# Patient Record
Sex: Female | Born: 2000 | Race: Black or African American | Hispanic: No | Marital: Single | Smoking: Never smoker
Health system: Southern US, Community
[De-identification: ages and names within clinical notes are randomized; demographics above are authoritative.]

## PROBLEM LIST (undated history)

## (undated) DIAGNOSIS — Z789 Other specified health status: Secondary | ICD-10-CM

## (undated) HISTORY — PX: NO PAST SURGERIES: SHX2092

## (undated) HISTORY — DX: Other specified health status: Z78.9

---

## 2019-01-28 ENCOUNTER — Emergency Department (HOSPITAL_COMMUNITY): Payer: BLUE CROSS/BLUE SHIELD

## 2019-01-28 ENCOUNTER — Emergency Department (HOSPITAL_COMMUNITY)
Admission: EM | Admit: 2019-01-28 | Discharge: 2019-01-28 | Disposition: A | Discharge status: Home / Self Care | Payer: BLUE CROSS/BLUE SHIELD | Attending: Emergency Medicine | Admitting: Emergency Medicine

## 2019-01-28 DIAGNOSIS — Y998 Other external cause status: Secondary | ICD-10-CM | POA: Diagnosis not present

## 2019-01-28 DIAGNOSIS — Y939 Activity, unspecified: Secondary | ICD-10-CM | POA: Insufficient documentation

## 2019-01-28 DIAGNOSIS — M25562 Pain in left knee: Secondary | ICD-10-CM | POA: Insufficient documentation

## 2019-01-28 DIAGNOSIS — Y92410 Unspecified street and highway as the place of occurrence of the external cause: Secondary | ICD-10-CM | POA: Diagnosis not present

## 2019-01-28 MED ORDER — ACETAMINOPHEN 325 MG PO TABS
650.0000 mg | ORAL_TABLET | Freq: Once | ORAL | Status: AC
  Administered 2019-01-28: 650 mg via ORAL
  Filled 2019-01-28: qty 2

## 2019-01-28 NOTE — Discharge Instructions (Addendum)
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
## 2020-07-22 ENCOUNTER — Ambulatory Visit: Payer: No Typology Code available for payment source | Attending: Internal Medicine

## 2020-07-22 DIAGNOSIS — Z23 Encounter for immunization: Secondary | ICD-10-CM

## 2020-07-22 NOTE — Progress Notes (Signed)
   Covid-19 Vaccination Clinic  Name:  Allison Salas    MRN: [id number] DOB: Dec 24, 2000  07/22/2020  [name] was observed post Covid-19 immunization for 15 minutes without incident. She was provided with Vaccine Information Sheet and instruction to access the V-Safe system.   [name] was instructed to call 911 with any severe reactions post vaccine: Marland Kitchen Difficulty breathing  . Swelling of face and throat  . A fast heartbeat  . A bad rash all over body  . Dizziness and weakness   Immunizations Administered    Name Date Dose VIS Date Route   Moderna COVID-19 Vaccine 07/22/2020 12:48 PM 0.5 mL 11/2019 Intramuscular   Manufacturer: Moderna   Lot: [id number]   NDC: 01749-449-67

## 2020-08-19 ENCOUNTER — Ambulatory Visit: Payer: No Typology Code available for payment source | Attending: Internal Medicine

## 2020-08-19 DIAGNOSIS — Z23 Encounter for immunization: Secondary | ICD-10-CM

## 2020-08-19 NOTE — Progress Notes (Signed)
   Covid-19 Vaccination Clinic  Name:  Allison Salas    MRN: [id number] DOB: [birth year]  08/19/2020  Ms. Wertheim was observed post Covid-19 immunization for 15 minutes without incident. She was provided with Vaccine Information Sheet and instruction to access the V-Safe system.   Ms. Harl was instructed to call 911 with any severe reactions post vaccine: Marland Kitchen Difficulty breathing  . Swelling of face and throat  . A fast heartbeat  . A bad rash all over body  . Dizziness and weakness   Immunizations Administered    Name Date Dose VIS Date Route   Moderna COVID-19 Vaccine 08/19/2020  9:30 AM 0.5 mL 11/2019 Intramuscular   Manufacturer: Moderna   Lot: [id number]   NDC: 15400-867-61

## 2020-11-24 ENCOUNTER — Emergency Department (HOSPITAL_COMMUNITY)
Admission: EM | Admit: 2020-11-24 | Discharge: 2020-11-25 | Disposition: A | Discharge status: Home / Self Care | Payer: BLUE CROSS/BLUE SHIELD | Attending: Emergency Medicine | Admitting: Emergency Medicine

## 2020-11-24 ENCOUNTER — Encounter (HOSPITAL_COMMUNITY): Payer: Self-pay | Admitting: Emergency Medicine

## 2020-11-24 ENCOUNTER — Other Ambulatory Visit: Payer: Self-pay

## 2020-11-24 DIAGNOSIS — R197 Diarrhea, unspecified: Secondary | ICD-10-CM | POA: Insufficient documentation

## 2020-11-24 DIAGNOSIS — R251 Tremor, unspecified: Secondary | ICD-10-CM | POA: Diagnosis not present

## 2020-11-24 DIAGNOSIS — F1590 Other stimulant use, unspecified, uncomplicated: Secondary | ICD-10-CM | POA: Diagnosis not present

## 2020-11-24 DIAGNOSIS — Z5321 Procedure and treatment not carried out due to patient leaving prior to being seen by health care provider: Secondary | ICD-10-CM | POA: Insufficient documentation

## 2020-11-24 DIAGNOSIS — R110 Nausea: Secondary | ICD-10-CM | POA: Diagnosis not present

## 2020-11-24 DIAGNOSIS — R42 Dizziness and giddiness: Secondary | ICD-10-CM | POA: Insufficient documentation

## 2020-11-24 NOTE — ED Triage Notes (Signed)
Patient smoked marijuana this evening then  felt dizzy ,shaking with nausea and diarrhea x1 . Respirations unlabored.

## 2020-11-25 LAB — BASIC METABOLIC PANEL
Anion gap: 9 (ref 5–15)
BUN: 9 mg/dL (ref 6–20)
CO2: 20 mmol/L — ABNORMAL LOW (ref 22–32)
Calcium: 9 mg/dL (ref 8.9–10.3)
Chloride: 105 mmol/L (ref 98–111)
Creatinine, Ser: 0.75 mg/dL (ref 0.44–1.00)
GFR, Estimated: >60 mL/min (ref >60)
Glucose, Bld: 116 mg/dL — ABNORMAL HIGH (ref 70–99)
Potassium: 3.1 mmol/L — ABNORMAL LOW (ref 3.5–5.1)
Sodium: 134 mmol/L — ABNORMAL LOW (ref 135–145)

## 2020-11-25 LAB — CBC WITH DIFFERENTIAL/PLATELET
Abs Immature Granulocytes: 0.03 10*3/uL (ref 0.00–0.07)
Basophils Absolute: 0 10*3/uL (ref 0.0–0.1)
Basophils Relative: 1 %
Eosinophils Absolute: 0.2 10*3/uL (ref 0.0–0.5)
Eosinophils Relative: 2 %
HCT: 37.8 % (ref 36.0–46.0)
Hemoglobin: 12.9 g/dL (ref 12.0–15.0)
Immature Granulocytes: 0 %
Lymphocytes Relative: 36 %
Lymphs Abs: 2.6 10*3/uL (ref 0.7–4.0)
MCH: 32.5 pg (ref 26.0–34.0)
MCHC: 34.1 g/dL (ref 30.0–36.0)
MCV: 95.2 fL (ref 80.0–100.0)
Monocytes Absolute: 1 10*3/uL (ref 0.1–1.0)
Monocytes Relative: 14 %
Neutro Abs: 3.4 10*3/uL (ref 1.7–7.7)
Neutrophils Relative %: 47 %
Platelets: 273 10*3/uL (ref 150–400)
RBC: 3.97 MIL/uL (ref 3.87–5.11)
RDW: 13.3 % (ref 11.5–15.5)
WBC: 7.2 10*3/uL (ref 4.0–10.5)
nRBC: 0 % (ref 0.0–0.2)

## 2020-11-25 LAB — URINALYSIS, ROUTINE W REFLEX MICROSCOPIC
Bilirubin Urine: NEGATIVE
Glucose, UA: NEGATIVE mg/dL
Hgb urine dipstick: NEGATIVE
Ketones, ur: NEGATIVE mg/dL
Leukocytes,Ua: NEGATIVE
Nitrite: NEGATIVE
Protein, ur: NEGATIVE mg/dL
Specific Gravity, Urine: 1.024 (ref 1.005–1.030)
pH: 5 (ref 5.0–8.0)

## 2020-11-25 LAB — I-STAT BETA HCG BLOOD, ED (MC, WL, AP ONLY): I-stat hCG, quantitative: <5 m[IU]/mL (ref <5)

## 2020-11-25 NOTE — ED Notes (Signed)
Patient states she knows she isnt supposed to leave but she needs to lay down. LWBS

## 2021-04-30 LAB — LAB REPORT - SCANNED: Preg, Serum: POSITIVE

## 2021-05-01 LAB — OB RESULTS CONSOLE ANTIBODY SCREEN: Antibody Screen: NEGATIVE

## 2021-05-01 LAB — OB RESULTS CONSOLE ABO/RH: RH Type: POSITIVE

## 2021-07-16 ENCOUNTER — Encounter: Payer: Self-pay | Admitting: *Deleted

## 2021-07-16 DIAGNOSIS — Z3400 Encounter for supervision of normal first pregnancy, unspecified trimester: Secondary | ICD-10-CM | POA: Insufficient documentation

## 2021-07-23 ENCOUNTER — Other Ambulatory Visit (HOSPITAL_COMMUNITY)
Admission: RE | Admit: 2021-07-23 | Discharge: 2021-07-23 | Disposition: A | Discharge status: Home / Self Care | Payer: BLUE CROSS/BLUE SHIELD | Source: Ambulatory Visit | Attending: Obstetrics and Gynecology | Admitting: Obstetrics and Gynecology

## 2021-07-23 ENCOUNTER — Other Ambulatory Visit: Payer: Self-pay

## 2021-07-23 ENCOUNTER — Encounter: Payer: Self-pay | Admitting: Obstetrics and Gynecology

## 2021-07-23 ENCOUNTER — Ambulatory Visit (INDEPENDENT_AMBULATORY_CARE_PROVIDER_SITE_OTHER): Payer: BLUE CROSS/BLUE SHIELD | Admitting: Obstetrics and Gynecology

## 2021-07-23 VITALS — BP 117/73 | HR 91 | Temp 98.3°F | Wt 146.0 lb

## 2021-07-23 DIAGNOSIS — Z3A16 16 weeks gestation of pregnancy: Secondary | ICD-10-CM | POA: Diagnosis not present

## 2021-07-23 DIAGNOSIS — Z3A28 28 weeks gestation of pregnancy: Secondary | ICD-10-CM | POA: Diagnosis not present

## 2021-07-23 DIAGNOSIS — Z9229 Personal history of other drug therapy: Secondary | ICD-10-CM | POA: Diagnosis not present

## 2021-07-23 DIAGNOSIS — Z3400 Encounter for supervision of normal first pregnancy, unspecified trimester: Secondary | ICD-10-CM | POA: Diagnosis present

## 2021-07-23 DIAGNOSIS — Z23 Encounter for immunization: Secondary | ICD-10-CM | POA: Diagnosis not present

## 2021-07-23 DIAGNOSIS — R12 Heartburn: Secondary | ICD-10-CM

## 2021-07-23 MED ORDER — FAMOTIDINE 20 MG PO TABS: 20.0000 mg | ORAL_TABLET | Freq: Two times a day (BID) | ORAL | 2 refills | Status: AC

## 2021-07-23 MED ORDER — BLOOD PRESSURE MONITOR AUTOMAT DEVI: 1.0000 | Freq: Every day | 0 refills | Status: AC

## 2021-07-23 MED ORDER — GOJJI WEIGHT SCALE MISC: 1.0000 | Freq: Every day | 0 refills | Status: AC | PRN

## 2021-07-23 NOTE — Progress Notes (Signed)
INITIAL OBSTETRICAL VISIT Patient name: Allison Salas MRN [id number]  Date of birth: August 28, 2001 Chief Complaint:   Initial Prenatal Visit (NOB Transfer)  History of Present Illness:   Allison Salas is a 20 y.o. G1P0 African American female at [redacted]w[redacted]d by LMP with an Estimated Date of Delivery: 01/02/22 being seen today for her initial obstetrical visit.  Her obstetrical history is significant for  none . This is an unplanned pregnancy. She is transferring her [facility] from [facility]. She is a Consulting civil engineer at [employer]. She and the father of the baby (FOB) "Detrich" do not live together. She has a support system that consists of FOB/family/friends. She plans to deliver in her home town in [place], because she will be on [place] break during her due date. Today she reports heartburn.   Patient's last menstrual period was 03/28/2021. Last pap n/a. Results were:  n/a due to age Review of Systems:   Pertinent items are noted in HPI Denies cramping/contractions, leakage of fluid, vaginal bleeding, abnormal vaginal discharge w/ itching/odor/irritation, headaches, visual changes, shortness of breath, chest pain, abdominal pain, severe nausea/vomiting, or problems with urination or bowel movements unless otherwise stated above.  Pertinent History Reviewed:  Reviewed past medical,surgical, social, obstetrical and family history.  Reviewed problem list, medications and allergies. OB History  Gravida Para Term Preterm AB Living  1            SAB IAB Ectopic Multiple Live Births               # Outcome Date GA Lbr Len/2nd Weight Sex Delivery Anes PTL Lv  1 Current            Physical Assessment:   Vitals:   07/23/21 1405  BP: 117/73  Pulse: 91  Temp: 98.3 F (36.8 C)  Weight: 146 lb (66.2 kg)  Body mass index is 25.86 kg/m.       Physical Examination:  General appearance - well appearing, and in no distress  Mental status - alert, oriented to person, place, and  time  Psych:  She has a normal mood and affect  Skin - warm and dry, normal color, no suspicious lesions noted  Chest - effort normal, all lung fields clear to auscultation bilaterally  Heart - normal rate and regular rhythm  Abdomen - soft, nontender  Extremities:  No swelling or varicosities noted  Pelvic - VULVA: normal appearing vulva with no masses, tenderness or lesions  VAGINA: normal appearing vagina with normal color and discharge, no lesions.   CERVIX: normal appearing cervix without discharge or lesions, no CMT  Thin prep pap is done with reflex HR HPV cotesting   FHTs by doppler: 147 bpm  Assessment & Plan:  1) Low-Risk Pregnancy G1P0 at [redacted]w[redacted]d with an Estimated Date of Delivery: 01/02/22   2) Initial OB visit - Welcomed to practice and introduced self to patient in addition to discussing other advanced practice providers that she may be seeing at this practice - Congratulated patient - Anticipatory guidance on upcoming appointments - Educated on COVID19 and pregnancy and the integration of virtual appointments  - Educated on babyscripts app- patient reports she has not received email, encouraged to look in spam folder and to call office if she still has not received email - patient verbalizes understanding    3) Supervision of normal first pregnancy, antepartum - Cytology - PAP( [place]) - Cervicovaginal ancillary only( [place]) - Obstetric Panel, Including HIV - Hepatitis C  antibody - Genetic Screening - Hemoglobin A1c - Culture, OB Urine - Blood Pressure Monitoring (BLOOD PRESSURE MONITOR AUTOMAT) DEVI; 1 Device by Does not apply route daily. Automatic blood pressure cuff regular size. To monitor blood pressure regularly at home. ICD-10 code:Z34.90  Dispense: 1 each; Refill: 0 - Misc. Devices (GOJJI WEIGHT SCALE) MISC; 1 Device by Does not apply route daily as needed. To weight self daily as needed at home. ICD-10 code: Z34.90  Dispense: 1 each; Refill: 0 - Enroll  Patient in PreNatal Babyscripts - AFP, Serum, Open Spina Bifida - famotidine (PEPCID) 20 MG tablet; Take 1 tablet (20 mg total) by mouth 2 (two) times daily.  Dispense: 30 tablet; Refill: 2 - [place] MFM OB COMP + 14 WK; Future  2. Heart burn - famotidine (PEPCID) 20 MG tablet; Take 1 tablet (20 mg total) by mouth 2 (two) times daily.  Dispense: 30 tablet; Refill: 2  3. [redacted] weeks gestation of pregnancy    Meds:  Meds ordered this encounter  Medications   Blood Pressure Monitoring (BLOOD PRESSURE MONITOR AUTOMAT) DEVI    Sig: 1 Device by Does not apply route daily. Automatic blood pressure cuff regular size. To monitor blood pressure regularly at home. ICD-10 code:Z34.90    Dispense:  1 each    Refill:  0   Misc. Devices (GOJJI WEIGHT SCALE) MISC    Sig: 1 Device by Does not apply route daily as needed. To weight self daily as needed at home. ICD-10 code: Z34.90    Dispense:  1 each    Refill:  0   famotidine (PEPCID) 20 MG tablet    Sig: Take 1 tablet (20 mg total) by mouth 2 (two) times daily.    Dispense:  30 tablet    Refill:  2    Initial labs obtained Continue prenatal vitamins Reviewed n/v relief measures and warning s/s to report Reviewed recommended weight gain based on pre-gravid BMI Encouraged well-balanced diet Genetic Screening discussed: ordered Cystic fibrosis, SMA, Fragile X screening discussed ordered The nature of [facility] with multiple MDs and other Advanced Practice Providers was explained to patient; also emphasized that residents, students are part of our team.  Discussed optimized OB schedule and video visits. Advised can have an in-office visit whenever she feels she needs to be seen.  Does not have own BP cuff. BP cuff Rx faxed today. Explained to patient that BP will be mailed to her house. Check BP weekly, let us know if >140/90. Advised to call during normal business hours and there is an after-hours nurse line  available.    Follow-up: Return in about 4 weeks (around 08/20/2021) for Return OB - My Chart video.   Orders Placed This Encounter  Procedures   Culture, OB Urine   [place] MFM OB COMP + 14 WK   Obstetric Panel, Including HIV   Hepatitis C antibody   Genetic Screening   Hemoglobin A1c   AFP, Serum, Open Spina Bifida   OB RESULTS CONSOLE ABO/Rh   OB RESULTS CONSOLE Antibody Screen    Raelyn Mora MSN, CNM 07/23/2021

## 2021-07-24 ENCOUNTER — Encounter: Payer: Self-pay | Admitting: Obstetrics and Gynecology

## 2021-07-24 LAB — CERVICOVAGINAL ANCILLARY ONLY
Bacterial Vaginitis (gardnerella): POSITIVE — AB
Candida Glabrata: NEGATIVE
Candida Vaginitis: NEGATIVE
Chlamydia: NEGATIVE
Comment: NEGATIVE
Comment: NEGATIVE
Comment: NEGATIVE
Comment: NEGATIVE
Comment: NEGATIVE
Comment: NORMAL
Neisseria Gonorrhea: NEGATIVE
Trichomonas: NEGATIVE

## 2021-07-24 LAB — CYTOLOGY - PAP: Diagnosis: NEGATIVE

## 2021-07-25 LAB — AFP, SERUM, OPEN SPINA BIFIDA
AFP MoM: 1.49
AFP Value: 58.3 ng/mL
Gest. Age on Collection Date: 16 weeks
Maternal Age At EDD: 22.8 yr
OSBR Risk 1 IN: 5617
Test Results:: NEGATIVE
Weight: 146 [lb_av]

## 2021-07-25 LAB — OBSTETRIC PANEL, INCLUDING HIV
Antibody Screen: NEGATIVE
Basophils Absolute: 0.1 10*3/uL (ref 0.0–0.2)
Basos: 1 %
EOS (ABSOLUTE): 0.1 10*3/uL (ref 0.0–0.4)
Eos: 1 %
HIV Screen 4th Generation wRfx: NONREACTIVE
Hematocrit: 42.5 % (ref 34.0–46.6)
Hemoglobin: 13.9 g/dL (ref 11.1–15.9)
Hepatitis B Surface Ag: NEGATIVE
Immature Grans (Abs): 0.4 10*3/uL — ABNORMAL HIGH (ref 0.0–0.1)
Immature Granulocytes: 3 %
Lymphocytes Absolute: 1.7 10*3/uL (ref 0.7–3.1)
Lymphs: 12 %
MCH: 31.7 pg (ref 26.6–33.0)
MCHC: 32.7 g/dL (ref 31.5–35.7)
MCV: 97 fL (ref 79–97)
Monocytes Absolute: 1.5 10*3/uL — ABNORMAL HIGH (ref 0.1–0.9)
Monocytes: 10 %
Neutrophils Absolute: 10.6 10*3/uL — ABNORMAL HIGH (ref 1.4–7.0)
Neutrophils: 73 %
Platelets: 287 10*3/uL (ref 150–450)
RBC: 4.38 x10E6/uL (ref 3.77–5.28)
RDW: 12.9 % (ref 11.7–15.4)
RPR Ser Ql: NONREACTIVE
Rh Factor: POSITIVE
Rubella Antibodies, IGG: 6.27 index (ref >0.99)
WBC: 14.4 10*3/uL — ABNORMAL HIGH (ref 3.4–10.8)

## 2021-07-25 LAB — HEMOGLOBIN A1C
Est. average glucose Bld gHb Est-mCnc: 105 mg/dL
Hgb A1c MFr Bld: 5.3 % (ref 4.8–5.6)

## 2021-07-25 LAB — CULTURE, OB URINE

## 2021-07-25 LAB — URINE CULTURE, OB REFLEX: Organism ID, Bacteria: NO GROWTH

## 2021-07-25 LAB — HEPATITIS C ANTIBODY: Hep C Virus Ab: <0.1 s/co ratio (ref 0.0–0.9)

## 2021-07-27 ENCOUNTER — Other Ambulatory Visit: Payer: Self-pay | Admitting: *Deleted

## 2021-07-27 DIAGNOSIS — B9689 Other specified bacterial agents as the cause of diseases classified elsewhere: Secondary | ICD-10-CM

## 2021-07-27 DIAGNOSIS — O23599 Infection of other part of genital tract in pregnancy, unspecified trimester: Secondary | ICD-10-CM

## 2021-07-27 DIAGNOSIS — Z3400 Encounter for supervision of normal first pregnancy, unspecified trimester: Secondary | ICD-10-CM

## 2021-07-27 MED ORDER — METRONIDAZOLE 500 MG PO TABS: 500.0000 mg | ORAL_TABLET | Freq: Two times a day (BID) | ORAL | 0 refills | Status: DC

## 2021-08-10 ENCOUNTER — Other Ambulatory Visit: Payer: Self-pay

## 2021-08-10 ENCOUNTER — Ambulatory Visit: Payer: BLUE CROSS/BLUE SHIELD | Attending: Obstetrics and Gynecology

## 2021-08-10 DIAGNOSIS — Z3400 Encounter for supervision of normal first pregnancy, unspecified trimester: Secondary | ICD-10-CM

## 2021-08-20 ENCOUNTER — Encounter: Payer: Self-pay | Admitting: Obstetrics and Gynecology

## 2021-08-20 ENCOUNTER — Other Ambulatory Visit: Payer: Self-pay

## 2021-08-20 ENCOUNTER — Telehealth (INDEPENDENT_AMBULATORY_CARE_PROVIDER_SITE_OTHER): Payer: BLUE CROSS/BLUE SHIELD | Admitting: Obstetrics and Gynecology

## 2021-08-20 VITALS — BP 121/71 | HR 96 | Wt 155.2 lb

## 2021-08-20 DIAGNOSIS — Z3A20 20 weeks gestation of pregnancy: Secondary | ICD-10-CM

## 2021-08-20 DIAGNOSIS — Z3402 Encounter for supervision of normal first pregnancy, second trimester: Secondary | ICD-10-CM

## 2021-08-20 DIAGNOSIS — Z3400 Encounter for supervision of normal first pregnancy, unspecified trimester: Secondary | ICD-10-CM

## 2021-08-20 NOTE — Progress Notes (Signed)
   MY CHART VIDEO VIRTUAL OBSTETRICS VISIT ENCOUNTER NOTE  I connected with Allison Salas on 08/20/21 at  2:20 PM EDT by My Chart video at home and verified that I am speaking with the correct person using two identifiers. Provider located at [facility] for [employer] at [place].   I discussed the limitations, risks, security and privacy concerns of performing an evaluation and management service by My Chart video and the availability of in person appointments. I also discussed with the patient that there may be a patient responsible charge related to this service. The patient expressed understanding and agreed to proceed.  I discussed the limitations of telemedicine and the availability of in person appointments.  Discussed there is a possibility of technology failure and discussed alternative modes of communication if that failure occurs.  Subjective:  Allison Salas is a 20 y.o. G1P0 at [redacted]w[redacted]d being followed for ongoing prenatal care.  She is currently monitored for the following issues for this low-risk pregnancy and has Supervision of normal first pregnancy, antepartum on their problem list.  Patient reports no complaints. Reports fetal movement. Denies any contractions, bleeding or leaking of fluid.   The following portions of the patient's history were reviewed and updated as appropriate: allergies, current medications, past family history, past medical history, past social history, past surgical history and problem list.   Objective:   General:  Alert, oriented and cooperative.   Mental Status: Normal mood and affect perceived. Normal judgment and thought content.  Rest of physical exam deferred due to type of encounter  BP 121/71   Pulse 96   Wt 155 lb 3.2 oz (70.4 kg)   LMP 03/28/2021   BMI 27.49 kg/m  **Done by patient's own at home BP cuff and scale  Assessment and Plan:  Pregnancy: G1P0 at [redacted]w[redacted]d  1. Supervision of normal first pregnancy, antepartum -  Reviewed normal pap - Took all BV meds and no complaints  2. [redacted] weeks gestation of pregnancy   Term labor symptoms and general obstetric precautions including but not limited to vaginal bleeding, contractions, leaking of fluid and fetal movement were reviewed in detail with the patient.  I discussed the assessment and treatment plan with the patient. The patient was provided an opportunity to ask questions and all were answered. The patient agreed with the plan and demonstrated an understanding of the instructions. The patient was advised to call back or seek an in-person office evaluation/go to MAU at [facility] for any urgent or concerning symptoms. Please refer to After Visit Summary for other counseling recommendations.   I provided 5 minutes of non-face-to-face time during this encounter. There was 5 minutes of chart review time spent prior to this encounter. Total time spent = 10 minutes.  Return in about 4 weeks (around 09/17/2021) for Return OB - My Chart video.  Future Appointments  Date Time Provider Department Center  09/17/2021  1:15 PM Raelyn Mora, CNM [facility] None  10/15/2021  8:35 AM Raelyn Mora, CNM [facility] None    Raelyn Mora, CNM Center for [employer], [facility]

## 2021-09-17 ENCOUNTER — Other Ambulatory Visit: Payer: Self-pay

## 2021-09-17 ENCOUNTER — Telehealth (INDEPENDENT_AMBULATORY_CARE_PROVIDER_SITE_OTHER): Payer: BLUE CROSS/BLUE SHIELD | Admitting: Obstetrics and Gynecology

## 2021-09-17 ENCOUNTER — Encounter: Payer: Self-pay | Admitting: Obstetrics and Gynecology

## 2021-09-17 VITALS — BP 121/75 | HR 81 | Wt 165.4 lb

## 2021-09-17 DIAGNOSIS — Z3403 Encounter for supervision of normal first pregnancy, third trimester: Secondary | ICD-10-CM

## 2021-09-17 DIAGNOSIS — Z3400 Encounter for supervision of normal first pregnancy, unspecified trimester: Secondary | ICD-10-CM

## 2021-09-17 DIAGNOSIS — Z3A34 34 weeks gestation of pregnancy: Secondary | ICD-10-CM

## 2021-09-17 DIAGNOSIS — Z0130 Encounter for examination of blood pressure without abnormal findings: Secondary | ICD-10-CM

## 2021-09-17 NOTE — Progress Notes (Signed)
MY CHART VIDEO VIRTUAL OBSTETRICS VISIT ENCOUNTER NOTE  I connected with Allison Salas on 09/17/21 at  1:15 PM EDT by My Chart video at home and verified that I am speaking with the correct person using two identifiers. Provider located at [employer] for [employer] at [place].   I discussed the limitations, risks, security and privacy concerns of performing an evaluation and management service by My Chart video and the availability of in person appointments. I also discussed with the patient that there may be a patient responsible charge related to this service. The patient expressed understanding and agreed to proceed.  I discussed the limitations of telemedicine and the availability of in person appointments.  Discussed there is a possibility of technology failure and discussed alternative modes of communication if that failure occurs.  Subjective:  Allison Salas is a 20 y.o. G1P0 at [redacted]w[redacted]d being followed for ongoing prenatal care.  She is currently monitored for the following issues for this low-risk pregnancy and has Supervision of normal first pregnancy, antepartum on their problem list.  Patient reports no complaints. Reports fetal movement. Denies any contractions, bleeding or leaking of fluid.   The following portions of the patient's history were reviewed and updated as appropriate: allergies, current medications, past family history, past medical history, past social history, past surgical history and problem list.   Objective:   General:  Alert, oriented and cooperative.   Mental Status: Normal mood and affect perceived. Normal judgment and thought content.  Rest of physical exam deferred due to type of encounter  BP 121/75   Pulse 81   Wt 165 lb 6.4 oz (75 kg)   LMP 03/28/2021   BMI 29.30 kg/m  **Done by patient's own at home BP cuff and scale  Assessment and Plan:  Pregnancy: G1P0 at [redacted]w[redacted]d  1. Supervision of normal first pregnancy, antepartum -  Patient with difficulties working BP cuff she has. Verbal instructions given on operating BP cuff and how to read the numbers on the screen. Return demonstration given with BP WNL - Anticipatory guidance for 2 hr GTT - advised to fast after midnight without anything to eat or drink (except for water), will have fasting blood drawn, drink the glucola drink (flavor choices: orange, fruit punch or lemon-lime), have a visit with a provider during the first hour of testing, wait in the lab waiting room to have blood drawn at 1 hour and then 2 hours after finishing glucola drink. - Patient plans to move back to [place], [place] on 10/31/21 - Explained that she could submit ROI at her next visit  2. [redacted] weeks gestation of pregnancy   Preterm labor symptoms and general obstetric precautions including but not limited to vaginal bleeding, contractions, leaking of fluid and fetal movement were reviewed in detail with the patient.  I discussed the assessment and treatment plan with the patient. The patient was provided an opportunity to ask questions and all were answered. The patient agreed with the plan and demonstrated an understanding of the instructions. The patient was advised to call back or seek an in-person office evaluation/go to MAU at [facility] for any urgent or concerning symptoms. Please refer to After Visit Summary for other counseling recommendations.   I provided 5 minutes of non-face-to-face time during this encounter. There was 5 minutes of chart review time spent prior to this encounter. Total time spent = 10 minutes.  Return in about 4 weeks (around 10/15/2021) for Return OB 2hr GTT.  Future Appointments  Date Time Provider Department Center  10/15/2021  8:35 AM Raelyn Mora, CNM [facility] None    Raelyn Mora, CNM Center for [employer], [facility]

## 2021-09-17 NOTE — Patient Instructions (Signed)
Oral Glucose Tolerance Test During Pregnancy °Why am I having this test? °The oral glucose tolerance test (OGTT) is done to check how your body processes blood sugar (glucose). This is one of several tests used to diagnose diabetes that develops during pregnancy (gestational diabetes mellitus). Gestational diabetes is a short-term form of diabetes that some women develop while they are pregnant. It usually occurs during the second trimester of pregnancy and goes away after delivery. °Testing, or screening, for gestational diabetes usually occurs at weeks 24-28 of pregnancy. You may have the OGTT test after having a 1-hour glucose screening test if the results from that test indicate that you may have gestational diabetes. This test may also be needed if: °You have a history of gestational diabetes. °There is a history of giving birth to very large babies or of losing pregnancies (having stillbirths). °You have signs and symptoms of diabetes, such as: °Changes in your eyesight. °Tingling or numbness in your hands or feet. °Changes in hunger, thirst, and urination, and these are not explained by your pregnancy. °What is being tested? °This test measures the amount of glucose in your blood at different times during a period of 3 hours. This shows how well your body can process glucose. °What kind of sample is taken? °Blood samples are required for this test. They are usually collected by inserting a needle into a blood vessel. °How do I prepare for this test? °For 3 days before your test, eat normally. Have plenty of carbohydrate-rich foods. °Follow instructions from your health care provider about: °Eating or drinking restrictions on the day of the test. You may be asked not to eat or drink anything other than water (to fast) starting 8-10 hours before the test. °Changing or stopping your regular medicines. Some medicines may interfere with this test. °Tell a health care provider about: °All medicines you are taking,  including vitamins, herbs, eye drops, creams, and over-the-counter medicines. °Any blood disorders you have. °Any surgeries you have had. °Any medical conditions you have. °What happens during the test? °First, your blood glucose will be measured. This is referred to as your fasting blood glucose because you fasted before the test. Then, you will drink a glucose solution that contains a certain amount of glucose. Your blood glucose will be measured again 1, 2, and 3 hours after you drink the solution. °This test takes about 3 hours to complete. You will need to stay at the testing location during this time. During the testing period: °Do not eat or drink anything other than the glucose solution. °Do not exercise. °Do not use any products that contain nicotine or tobacco, such as cigarettes, e-cigarettes, and chewing tobacco. These can affect your test results. If you need help quitting, ask your health care provider. °The testing procedure may vary among health care providers and hospitals. °How are the results reported? °Your results will be reported as milligrams of glucose per deciliter of blood (mg/dL) or millimoles per liter (mmol/L). There is more than one source for screening and diagnosis reference values used to diagnose gestational diabetes. Your health care provider will compare your results to normal values that were established after testing a large group of people (reference values). Reference values may vary among labs and hospitals. For this test (Carpenter-Coustan), reference values are: °Fasting: 95 mg/dL (5.3 mmol/L). °1 hour: 180 mg/dL (10.0 mmol/L). °2 hour: 155 mg/dL (8.6 mmol/L). °3 hour: 140 mg/dL (7.8 mmol/L). °What do the results mean? °Results below the reference values are considered   normal. If two or more of your blood glucose levels are at or above the reference values, you may be diagnosed with gestational diabetes. If only one level is high, your health care provider may suggest  repeat testing or other tests to confirm a diagnosis. °Talk with your health care provider about what your results mean. °Questions to ask your health care provider °Ask your health care provider, or the department that is doing the test: °When will my results be ready? °How will I get my results? °What are my treatment options? °What other tests do I need? °What are my next steps? °Summary °The oral glucose tolerance test (OGTT) is one of several tests used to diagnose diabetes that develops during pregnancy (gestational diabetes mellitus). Gestational diabetes is a short-term form of diabetes that some women develop while they are pregnant. °You may have the OGTT test after having a 1-hour glucose screening test if the results from that test show that you may have gestational diabetes. You may also have this test if you have any symptoms or risk factors for this type of diabetes. °Talk with your health care provider about what your results mean. °This information is not intended to replace advice given to you by your health care provider. Make sure you discuss any questions you have with your health care provider. °Document Revised: 05/08/2020 Document Reviewed: 05/08/2020 °Elsevier Patient Education © 2022 Elsevier Inc. ° °

## 2021-10-07 ENCOUNTER — Inpatient Hospital Stay (HOSPITAL_COMMUNITY)
Admission: AD | Admit: 2021-10-07 | Discharge: 2021-10-07 | Disposition: A | Discharge status: Home / Self Care | Payer: BLUE CROSS/BLUE SHIELD | Attending: Obstetrics and Gynecology | Admitting: Obstetrics and Gynecology

## 2021-10-07 ENCOUNTER — Encounter (HOSPITAL_COMMUNITY): Payer: Self-pay | Admitting: Obstetrics and Gynecology

## 2021-10-07 DIAGNOSIS — N858 Other specified noninflammatory disorders of uterus: Secondary | ICD-10-CM

## 2021-10-07 DIAGNOSIS — S39011A Strain of muscle, fascia and tendon of abdomen, initial encounter: Secondary | ICD-10-CM | POA: Diagnosis not present

## 2021-10-07 DIAGNOSIS — O26892 Other specified pregnancy related conditions, second trimester: Secondary | ICD-10-CM

## 2021-10-07 DIAGNOSIS — T59891A Toxic effect of other specified gases, fumes and vapors, accidental (unintentional), initial encounter: Secondary | ICD-10-CM | POA: Diagnosis not present

## 2021-10-07 DIAGNOSIS — O9A212 Injury, poisoning and certain other consequences of external causes complicating pregnancy, second trimester: Secondary | ICD-10-CM | POA: Insufficient documentation

## 2021-10-07 DIAGNOSIS — Z77098 Contact with and (suspected) exposure to other hazardous, chiefly nonmedicinal, chemicals: Secondary | ICD-10-CM

## 2021-10-07 DIAGNOSIS — Z79899 Other long term (current) drug therapy: Secondary | ICD-10-CM | POA: Insufficient documentation

## 2021-10-07 DIAGNOSIS — Z3A27 27 weeks gestation of pregnancy: Secondary | ICD-10-CM | POA: Insufficient documentation

## 2021-10-07 DIAGNOSIS — O4702 False labor before 37 completed weeks of gestation, second trimester: Secondary | ICD-10-CM

## 2021-10-07 DIAGNOSIS — X58XXXA Exposure to other specified factors, initial encounter: Secondary | ICD-10-CM | POA: Insufficient documentation

## 2021-10-07 DIAGNOSIS — Z3400 Encounter for supervision of normal first pregnancy, unspecified trimester: Secondary | ICD-10-CM

## 2021-10-07 LAB — URINALYSIS, ROUTINE W REFLEX MICROSCOPIC
Bilirubin Urine: NEGATIVE
Glucose, UA: NEGATIVE mg/dL
Hgb urine dipstick: NEGATIVE
Ketones, ur: NEGATIVE mg/dL
Leukocytes,Ua: NEGATIVE
Nitrite: NEGATIVE
Protein, ur: NEGATIVE mg/dL
Specific Gravity, Urine: 1.021 (ref 1.005–1.030)
pH: 6 (ref 5.0–8.0)

## 2021-10-07 MED ORDER — NIFEDIPINE 10 MG PO CAPS
10.0000 mg | ORAL_CAPSULE | ORAL | Status: DC | PRN
  Administered 2021-10-07 (×2): 10 mg via ORAL
  Filled 2021-10-07 (×2): qty 1

## 2021-10-07 MED ORDER — TRAMADOL HCL 50 MG PO TABS
50.0000 mg | ORAL_TABLET | Freq: Once | ORAL | Status: AC
  Administered 2021-10-07: 50 mg via ORAL
  Filled 2021-10-07: qty 1
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
## 2021-10-15 ENCOUNTER — Ambulatory Visit (INDEPENDENT_AMBULATORY_CARE_PROVIDER_SITE_OTHER): Payer: BLUE CROSS/BLUE SHIELD | Admitting: Obstetrics and Gynecology

## 2021-10-15 ENCOUNTER — Other Ambulatory Visit: Payer: Self-pay

## 2021-10-15 VITALS — BP 116/69 | HR 97 | Temp 98.0°F | Wt 171.2 lb

## 2021-10-15 DIAGNOSIS — Z3400 Encounter for supervision of normal first pregnancy, unspecified trimester: Secondary | ICD-10-CM | POA: Diagnosis not present

## 2021-10-15 DIAGNOSIS — Z3A28 28 weeks gestation of pregnancy: Secondary | ICD-10-CM

## 2021-10-15 DIAGNOSIS — Z9229 Personal history of other drug therapy: Secondary | ICD-10-CM

## 2021-10-15 DIAGNOSIS — Z23 Encounter for immunization: Secondary | ICD-10-CM | POA: Diagnosis not present

## 2021-10-15 NOTE — Patient Instructions (Addendum)
Iron-Rich Diet Iron is a mineral that helps your body produce hemoglobin. Hemoglobin is a protein in red blood cells that carries oxygen to your body's tissues. Eating too little iron may cause you to feel weak and tired, and it can increase your risk of infection. Iron is naturally found in many foods, and many foods have iron added to them (are iron-fortified). You may need to follow an iron-rich diet if you do not have enough iron in your body due to certain medical conditions. The amount of iron that you need each day depends on your age, your sex, and any medical conditions you have. Follow instructions from your health care provider or a dietitian about how much iron you should eat each day. What are tips for following this plan? Reading food labels Check food labels to see how many milligrams (mg) of iron are in each serving. Cooking Cook foods in pots and pans that are made from iron. Take these steps to make it easier for your body to absorb iron from certain foods: Soak beans overnight before cooking. Soak whole grains overnight and drain them before using. Ferment flours before baking, such as by using yeast in bread dough. Meal planning When you eat foods that contain iron, you should eat them with foods that are high in vitamin C. These include oranges, peppers, tomatoes, potatoes, and mangoes. Vitamin C helps your body absorb iron. Certain foods and drinks prevent your body from absorbing iron properly. Avoid eating these foods in the same meal as iron-rich foods or with iron supplements. These foods include: Coffee, black tea, and red wine. Milk, dairy products, and foods that are high in calcium. Beans and soybeans. Whole grains. General information Take iron supplements only as told by your health care provider. An overdose of iron can be life-threatening. If you were prescribed iron supplements, take them with orange juice or a vitamin C supplement. When you eat iron-fortified  foods or take an iron supplement, you should also eat foods that naturally contain iron, such as meat, poultry, and fish. Eating naturally iron-rich foods helps your body absorb the iron that is added to other foods or contained in a supplement. Iron from animal sources is better absorbed than iron from plant sources. What foods should I eat? Fruits Prunes. Raisins. Eat fruits high in vitamin C, such as oranges, grapefruits, and strawberries, with iron-rich foods. Vegetables Spinach (cooked). Green peas. Broccoli. Fermented vegetables. Eat vegetables high in vitamin C, such as leafy greens, potatoes, bell peppers, and tomatoes, with iron-rich foods. Grains Iron-fortified breakfast cereal. Iron-fortified whole-wheat bread. Enriched rice. Sprouted grains. Meats and other proteins Beef liver. Beef. [place]. Chicken. Oysters. Shrimp. [place]. Sardines. Chickpeas. Nuts. Tofu. Pumpkin seeds. Beverages Tomato juice. Fresh orange juice. Prune juice. Hibiscus tea. Iron-fortified instant breakfast shakes. Sweets and desserts Blackstrap molasses. Seasonings and condiments Tahini. Fermented soy sauce. Other foods Wheat germ. The items listed above may not be a complete list of recommended foods and beverages. Contact a dietitian for more information. What foods should I limit? These are foods that should be limited while eating iron-rich foods as they can reduce the absorption of iron in your body. Grains Whole grains. Bran cereal. Bran flour. Meats and other proteins Soybeans. Products made from soy protein. Black beans. Lentils. Mung beans. Split peas. Dairy Milk. Cream. Cheese. Yogurt. Cottage cheese. Beverages Coffee. Black tea. Red wine. Sweets and desserts Cocoa. Chocolate. Ice cream. Seasonings and condiments Basil. Oregano. Large amounts of parsley. The items listed above  may not be a complete list of foods and beverages you should limit. Contact a dietitian for more  information. Summary Iron is a mineral that helps your body produce hemoglobin. Hemoglobin is a protein in red blood cells that carries oxygen to your body's tissues. Iron is naturally found in many foods, and many foods have iron added to them (are iron-fortified). When you eat foods that contain iron, you should eat them with foods that are high in vitamin C. Vitamin C helps your body absorb iron. Certain foods and drinks prevent your body from absorbing iron properly, such as whole grains and dairy products. You should avoid eating these foods in the same meal as iron-rich foods or with iron supplements. This information is not intended to replace advice given to you by your health care provider. Make sure you discuss any questions you have with your health care provider. Document Revised: 11/10/2020 Document Reviewed: 11/10/2020 Elsevier Patient Education  2022 Elsevier Inc.   Please remember that if you are not able to keep your appointment to call the office and reschedule, or cancel. If you no-show or cancel with less than 24 hour notice we will charge you, not your insurance a $50 fee.

## 2021-10-15 NOTE — Progress Notes (Addendum)
LOW-RISK PREGNANCY OFFICE VISIT Patient name: Allison Salas MRN [id number]  Date of birth: Oct 21, 2001 Chief Complaint:   Routine Prenatal Visit  History of Present Illness:   Burnice Vassel is a 20 y.o. G1P0 female at [redacted]w[redacted]d with an Estimated Date of Delivery: 01/02/22 being seen today for ongoing management of a low-risk pregnancy.  Today she reports no complaints. She reports this will be her last appointment d/t her relocating to [place]. She has found an OB provider to transfer. She is unsure of family planning method she will choose for after delivery. She has only heard bad things about the various BC options and she just doesn't want any of them.  She states, "We (FOB and patient) are just not going to have sex. We have been doing good so far." Contractions: Not present. Vag. Bleeding: None.  Movement: Present. denies leaking of fluid. Review of Systems:   Pertinent items are noted in HPI Denies abnormal vaginal discharge w/ itching/odor/irritation, headaches, visual changes, shortness of breath, chest pain, abdominal pain, severe nausea/vomiting, or problems with urination or bowel movements unless otherwise stated above. Pertinent History Reviewed:  Reviewed past medical,surgical, social, obstetrical and family history.  Reviewed problem list, medications and allergies. Physical Assessment:   Vitals:   10/15/21 0828  BP: 116/69  Pulse: 97  Temp: 98 F (36.7 C)  Weight: 171 lb 3.2 oz (77.7 kg)  Body mass index is 30.33 kg/m.        Physical Examination:   General appearance: Well appearing, and in no distress  Mental status: Alert, oriented to person, place, and time  Skin: Warm & dry  Cardiovascular: Normal heart rate noted  Respiratory: Normal respiratory effort, no distress  Abdomen: Soft, gravid, nontender  Pelvic: Cervical exam deferred         Extremities: Edema: None  Fetal Status: Fetal Heart Rate (bpm): 142 Fundal Height: 29 cm Movement: Present  Presentation: Undeterminable  No results found for this or any previous visit (from the past 24 hour(s)).  Assessment & Plan:  1) Low-risk pregnancy G1P0 at [redacted]w[redacted]d with an Estimated Date of Delivery: 01/02/22   2) Supervision of normal first pregnancy, antepartum  - Glucose Tolerance, 2 Hours w/1 Hour,  - HIV Antibody (routine testing w rflx),  - RPR,  - CBC,  - Tdap vaccine greater than or equal to 7 yo IM - Advised to consider Nexplanon, Depo or condoms for family planning method after delivery, because abstinence only works if it is used 100% of the time. - ROI completed - records will be faxed to new OB provider  3) History of tetanus, diphtheria, and acellular pertussis booster vaccination (Tdap)  - Tdap vaccine greater than or equal to 7 yo IM  4) [redacted] weeks gestation of pregnancy   5) Need for immunization against influenza  - Flu Vaccine QUAD 65mo+IM (Fluarix, Fluzone & Alfiuria Quad PF)   Meds: No orders of the defined types were placed in this encounter.  Labs/procedures today: 2 hr GTT, 3rd trimester labs, TdaP and flu vaccine  Plan:  Continue routine obstetrical care   Reviewed: Preterm labor symptoms and general obstetric precautions including but not limited to vaginal bleeding, contractions, leaking of fluid and fetal movement were reviewed in detail with the patient.  All questions were answered. Has home bp cuff. Check bp weekly, let us know if >140/90.   Follow-up: No follow-ups on file.  Orders Placed This Encounter  Procedures   Tdap vaccine greater than or  equal to 7yo IM   Flu Vaccine QUAD 29mo+IM (Fluarix, Fluzone & Alfiuria Quad PF)   Glucose Tolerance, 2 Hours w/1 Hour   HIV Antibody (routine testing w rflx)   RPR   CBC   Raelyn Mora MSN, CNM 10/15/2021 8:45 AM

## 2021-10-16 LAB — HIV ANTIBODY (ROUTINE TESTING W REFLEX): HIV Screen 4th Generation wRfx: NONREACTIVE

## 2021-10-16 LAB — CBC
Hematocrit: 33 % — ABNORMAL LOW (ref 34.0–46.6)
Hemoglobin: 11.4 g/dL (ref 11.1–15.9)
MCH: 31.5 pg (ref 26.6–33.0)
MCHC: 34.5 g/dL (ref 31.5–35.7)
MCV: 91 fL (ref 79–97)
Platelets: 286 10*3/uL (ref 150–450)
RBC: 3.62 x10E6/uL — ABNORMAL LOW (ref 3.77–5.28)
RDW: 11.8 % (ref 11.7–15.4)
WBC: 13.9 10*3/uL — ABNORMAL HIGH (ref 3.4–10.8)

## 2021-10-16 LAB — RPR: RPR Ser Ql: NONREACTIVE

## 2021-10-16 LAB — GLUCOSE TOLERANCE, 2 HOURS W/ 1HR
Glucose, 1 hour: 148 mg/dL (ref 70–179)
Glucose, 2 hour: 119 mg/dL (ref 70–152)
Glucose, Fasting: 79 mg/dL (ref 70–91)

## 2023-04-13 IMAGING — US US MFM OB COMP +14 WKS
1 series · 13 of 28 positions shown · non-contrast
Comparison: none

[Series 1: us mfm ob comp +14 wks · 13 of 137 slices shown]
[im 6/137]
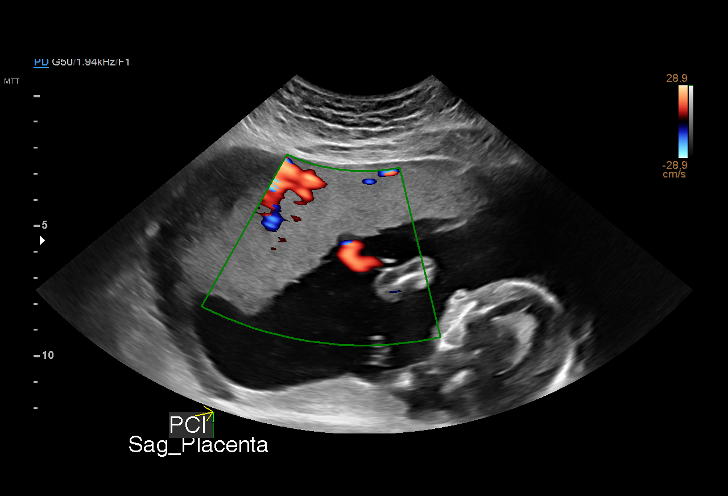
[im 16/137]
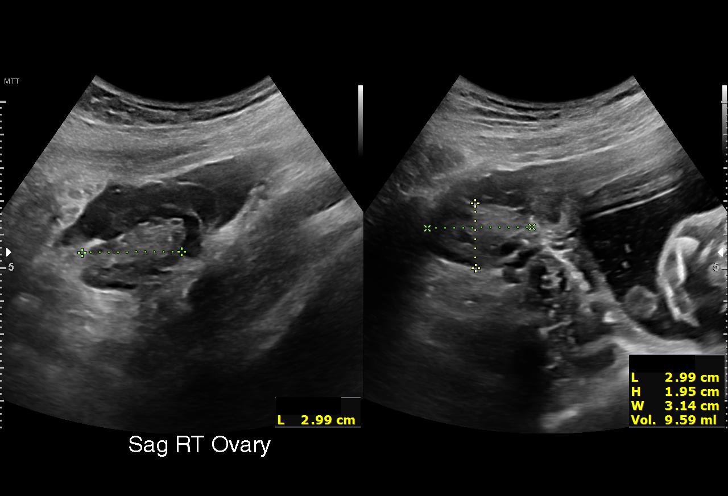
[im 26/137]
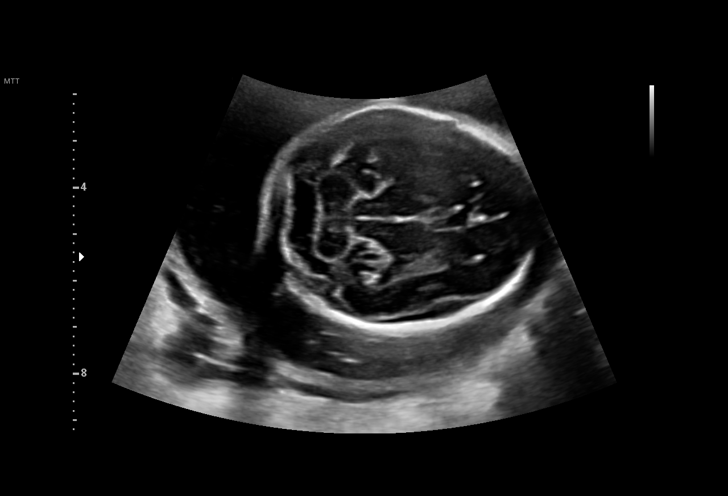
[im 36/137]
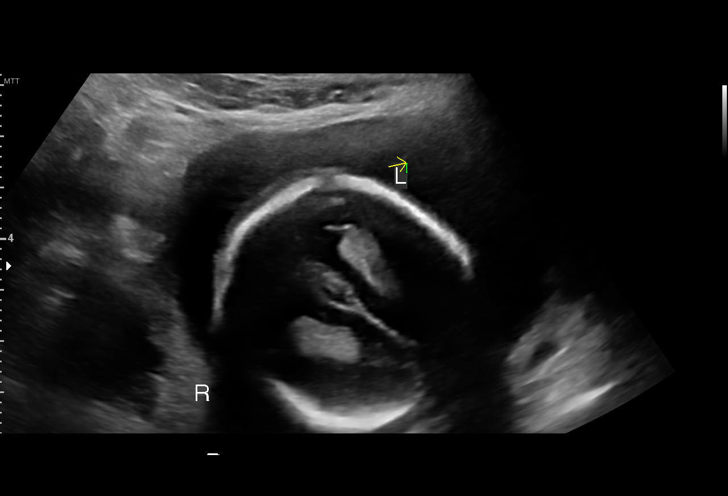
[im 46/137]
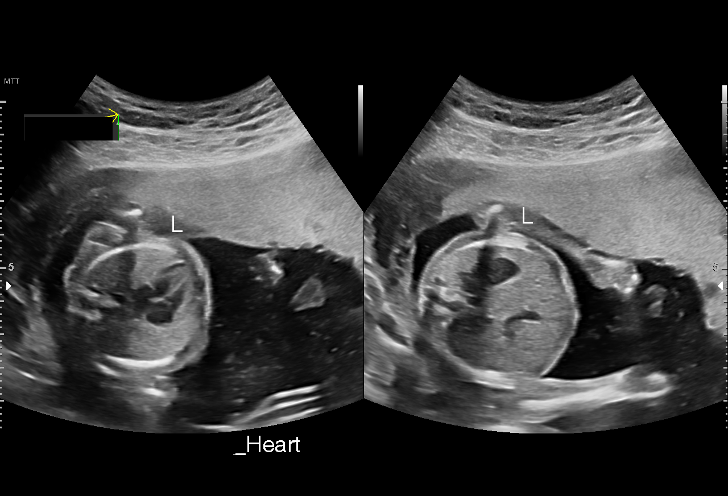
[im 56/137]
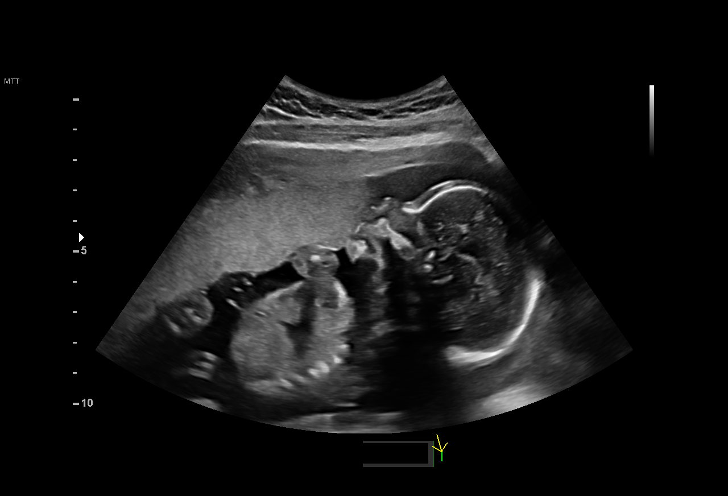
[im 71/137]
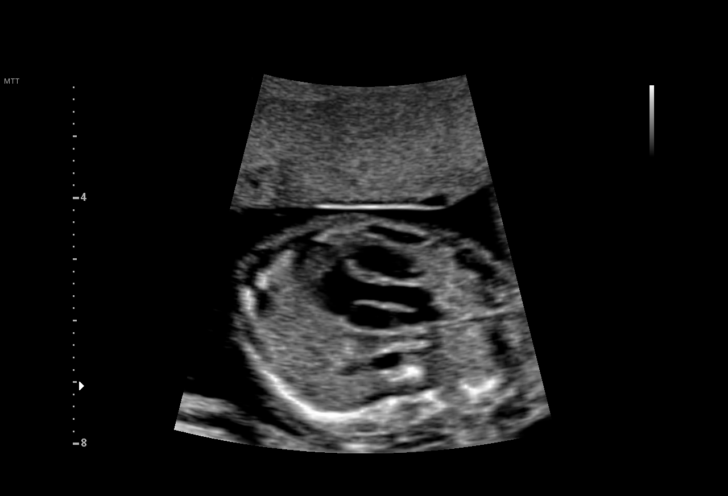
[im 81/137]
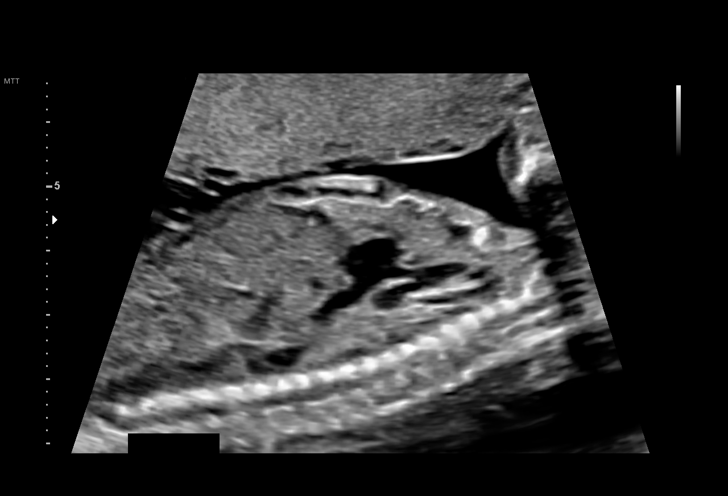
[im 91/137]
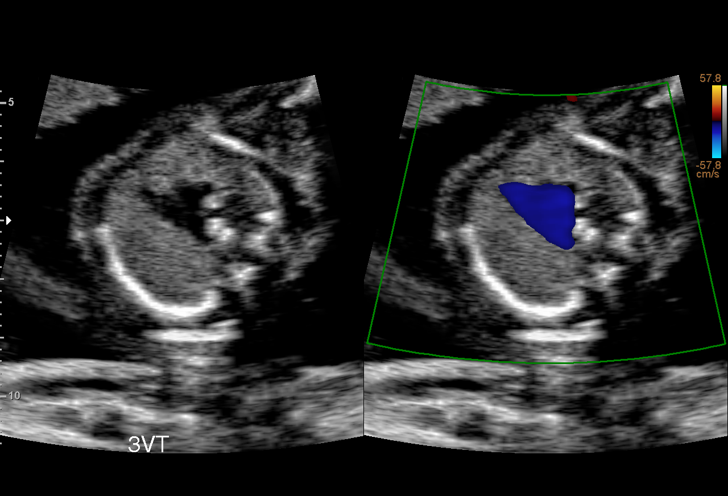
[im 101/137]
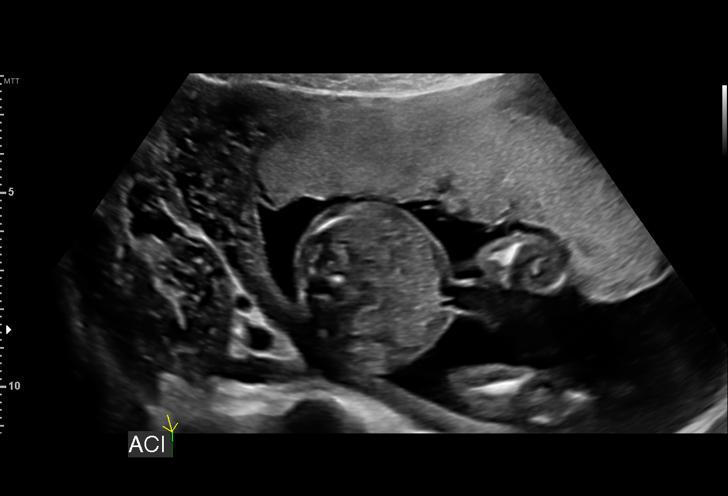
[im 111/137]
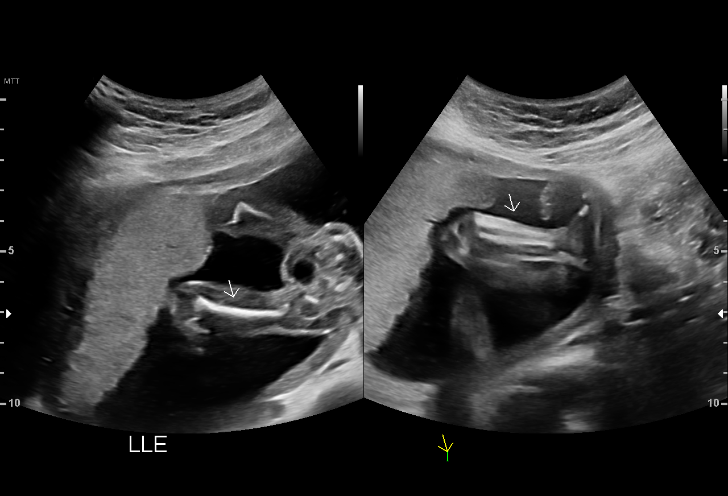
[im 121/137]
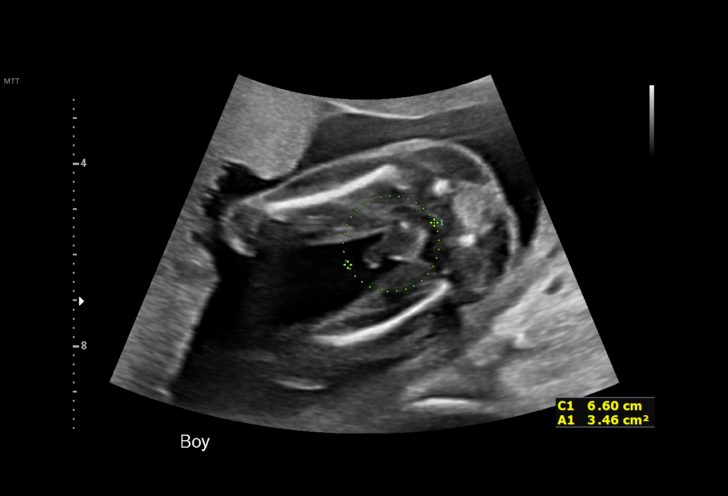
[im 131/137]
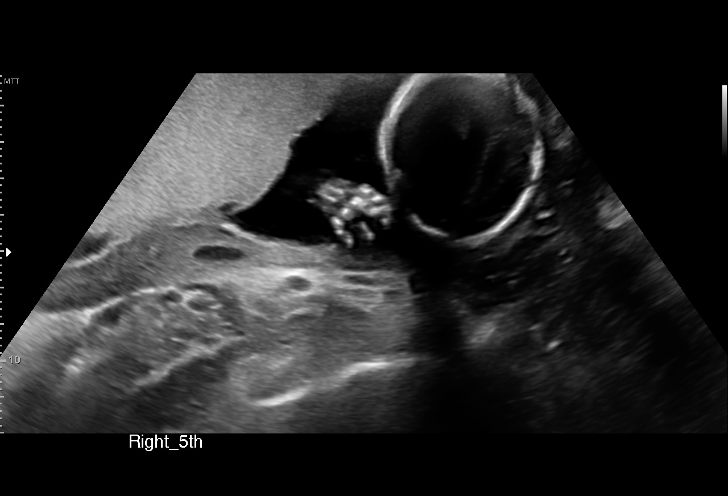

[13 of 28 positions shown; findings below may reference images not displayed]

[facility] CNM                               [HOSPITAL] at

 1  US MFM OB COMP + 14 WK                [address].01    WO[facility]

Indications

 19 weeks gestation of pregnancy
 Encounter for antenatal screening for
 malformations
 LR NIPS/ Negative Horizon/ Negative AFP
Fetal Evaluation

 Num Of Fetuses:         1
 Fetal Heart Rate(bpm):  144
 Cardiac Activity:       Observed
 Presentation:           Cephalic
 Placenta:               Anterior
 P. Cord Insertion:      Visualized, central

 Amniotic Fluid
 AFI FV:      Within normal limits

                             Largest Pocket(cm)

Biometry

 BPD:      46.8  mm     G. Age:  20w 1d         83  %    CI:        76.44   %    70 - 86
                                                         FL/HC:      17.6   %    16.1 -
 HC:      169.6  mm     G. Age:  19w 4d         57  %    HC/AC:      1.15        1.09 -
 AC:      147.9  mm     G. Age:  20w 0d         71  %    FL/BPD:     63.7   %
 FL:       29.8  mm     G. Age:  19w 1d         39  %    FL/AC:      20.1   %    20 - 24
 HUM:      27.9  mm     G. Age:  19w 0d         42  %
 CER:      21.2  mm     G. Age:  20w 1d         85  %
 NFT:       3.4  mm

 LV:        7.4  mm
 CM:        6.3  mm

 Est. FW:     306  gm    0 lb 11 oz      69  %
OB History

 Blood Type:   O+
 Gravidity:    1         Term:   0        Prem:   0        SAB:   0
 TOP:          0       Ectopic:  0        Living: 0
Gestational Age

 LMP:           19w 2d        Date:  03/28/21                 EDD:   01/02/22
 U/S Today:     19w 5d                                        EDD:   12/30/21
 Best:          19w 2d     Det. By:  LMP  (03/28/21)          EDD:   01/02/22
Anatomy

 Cranium:               Appears normal         LVOT:                   Appears normal
 Cavum:                 Appears normal         Aortic Arch:            Appears normal
 Ventricles:            Appears normal         Ductal Arch:            Appears normal
 Choroid Plexus:        Appears normal         Diaphragm:              Appears normal
 Cerebellum:            Appears normal         Stomach:                Appears normal, left
                                                                       sided
 Posterior Fossa:       Appears normal         Abdomen:                Appears normal
 Nuchal Fold:           Appears normal         Abdominal Wall:         Appears nml (cord
                                                                       insert, abd wall)
 Face:                  Appears normal         Cord Vessels:           Appears normal (3
                        (orbits and profile)                           vessel cord)
 Lips:                  Appears normal         Kidneys:                Appear normal
 Palate:                Not well visualized    Bladder:                Appears normal
 Thoracic:              Appears normal         Spine:                  Appears normal
 Heart:                 Appears normal         Upper Extremities:      Appears normal
                        (4CH, axis, and
                        situs)
 RVOT:                  Appears normal         Lower Extremities:      Appears normal

 Other:  Parents do not wish to know sex of fetus at this time. Normal male
         genaitalia. Nasal Bone, Lenses, VC, 3VV, 3VT, Heels/feet, and open
         hands/5th digits visualized.
Cervix Uterus Adnexa

 Cervix
 Length:           3.22  cm.
 Normal appearance by transabdominal scan.

 Uterus
 No abnormality visualized.

 Right Ovary
 Within normal limits.

 Left Ovary
 Within normal limits.
 Cul De Sac
 No free fluid seen.

 Adnexa
 No abnormality visualized.
Impression

 G1 P0. Patient is here for fetal anatomy scan.
 We performed fetal anatomy scan. No makers of
 aneuploidies or fetal structural defects are seen. Fetal
 biometry is consistent with her previously-established dates.
 Amniotic fluid is normal and good fetal activity is seen.
 Patient understands the limitations of ultrasound in detecting
 fetal anomalies.

 "MyChart":Patient does not want to know fetal gender. We
 informed her that fetal gender will be mentioned in ultrasound
 report and will be in "MyChart" that will be accessible to the
 patient .
Recommendations

 Follow-up scans as clinically indicated.
                 Sngh, Diwan Singh
